# Patient Record
Sex: Female | Born: 1995 | Race: White | Hispanic: No | Marital: Single | Smoking: Never smoker
Health system: Southern US, Community
[De-identification: ages and names within clinical notes are randomized; demographics above are authoritative.]

---

## 2020-11-06 DIAGNOSIS — N3000 Acute cystitis without hematuria: Secondary | ICD-10-CM | POA: Diagnosis not present

## 2021-02-24 ENCOUNTER — Other Ambulatory Visit: Payer: Self-pay

## 2021-02-24 ENCOUNTER — Encounter: Payer: Self-pay | Admitting: Emergency Medicine

## 2021-02-24 DIAGNOSIS — R1032 Left lower quadrant pain: Secondary | ICD-10-CM | POA: Insufficient documentation

## 2021-02-24 DIAGNOSIS — R197 Diarrhea, unspecified: Secondary | ICD-10-CM | POA: Diagnosis not present

## 2021-02-24 DIAGNOSIS — R112 Nausea with vomiting, unspecified: Secondary | ICD-10-CM | POA: Diagnosis not present

## 2021-02-24 LAB — COMPREHENSIVE METABOLIC PANEL
ALT: 19 U/L (ref 0–44)
AST: 22 U/L (ref 15–41)
Albumin: 3.8 g/dL (ref 3.5–5.0)
Alkaline Phosphatase: 55 U/L (ref 38–126)
Anion gap: 8 (ref 5–15)
BUN: 16 mg/dL (ref 6–20)
CO2: 25 mmol/L (ref 22–32)
Calcium: 9.1 mg/dL (ref 8.9–10.3)
Chloride: 107 mmol/L (ref 98–111)
Creatinine, Ser: 0.72 mg/dL (ref 0.44–1.00)
GFR, Estimated: >60 mL/min (ref >60)
Glucose, Bld: 115 mg/dL — ABNORMAL HIGH (ref 70–99)
Potassium: 3.9 mmol/L (ref 3.5–5.1)
Sodium: 140 mmol/L (ref 135–145)
Total Bilirubin: 0.5 mg/dL (ref 0.3–1.2)
Total Protein: 6.7 g/dL (ref 6.5–8.1)

## 2021-02-24 LAB — LIPASE, BLOOD: Lipase: 34 U/L (ref 11–51)

## 2021-02-24 LAB — URINALYSIS, COMPLETE (UACMP) WITH MICROSCOPIC
Bilirubin Urine: NEGATIVE
Glucose, UA: NEGATIVE mg/dL
Ketones, ur: 5 mg/dL — AB
Leukocytes,Ua: NEGATIVE
Nitrite: NEGATIVE
Protein, ur: 30 mg/dL — AB
Specific Gravity, Urine: 1.033 — ABNORMAL HIGH (ref 1.005–1.030)
pH: 5 (ref 5.0–8.0)

## 2021-02-24 LAB — CBC
HCT: 42.6 % (ref 36.0–46.0)
Hemoglobin: 14.3 g/dL (ref 12.0–15.0)
MCH: 30.4 pg (ref 26.0–34.0)
MCHC: 33.6 g/dL (ref 30.0–36.0)
MCV: 90.6 fL (ref 80.0–100.0)
Platelets: 306 10*3/uL (ref 150–400)
RBC: 4.7 MIL/uL (ref 3.87–5.11)
RDW: 12.9 % (ref 11.5–15.5)
WBC: 17 10*3/uL — ABNORMAL HIGH (ref 4.0–10.5)
nRBC: 0 % (ref 0.0–0.2)

## 2021-02-24 LAB — POC URINE PREG, ED: Preg Test, Ur: NEGATIVE

## 2021-02-24 MED ORDER — ONDANSETRON 4 MG PO TBDP
4.0000 mg | ORAL_TABLET | Freq: Once | ORAL | Status: AC | PRN
  Administered 2021-02-24: 4 mg via ORAL
  Filled 2021-02-24: qty 1

## 2021-02-24 NOTE — ED Triage Notes (Signed)
Pt in via POV, reports sudden onset lower abdominal pain with N/V, denies diarrhea.  Vitals WDL, NAD noted at this time.

## 2021-02-25 ENCOUNTER — Emergency Department: Payer: 59

## 2021-02-25 ENCOUNTER — Emergency Department
Admission: EM | Admit: 2021-02-25 | Discharge: 2021-02-25 | Disposition: A | Discharge status: Home / Self Care | Payer: 59 | Attending: Emergency Medicine | Admitting: Emergency Medicine

## 2021-02-25 DIAGNOSIS — R197 Diarrhea, unspecified: Secondary | ICD-10-CM | POA: Diagnosis not present

## 2021-02-25 DIAGNOSIS — R112 Nausea with vomiting, unspecified: Secondary | ICD-10-CM | POA: Diagnosis not present

## 2021-02-25 DIAGNOSIS — R1032 Left lower quadrant pain: Secondary | ICD-10-CM

## 2021-02-25 DIAGNOSIS — R1030 Lower abdominal pain, unspecified: Secondary | ICD-10-CM

## 2021-02-25 MED ORDER — ACETAMINOPHEN 500 MG PO TABS
1000.0000 mg | ORAL_TABLET | Freq: Once | ORAL | Status: AC
  Administered 2021-02-25: 1000 mg via ORAL
  Filled 2021-02-25: qty 2

## 2021-02-25 NOTE — Discharge Instructions (Addendum)
Your workup in the Emergency Department today was reassuring.  We did not find any specific abnormalities.  We recommend you drink plenty of fluids, take your regular medications and/or any new ones prescribed today, and follow up with the doctor(s) listed in these documents as recommended.  Return to the Emergency Department if you develop new or worsening symptoms that concern you.  

## 2021-02-25 NOTE — ED Provider Notes (Signed)
[facility] Emergency Department Provider Note  ____________________________________________   Event Date/Time   First MD Initiated Contact with Patient 02/25/21 0205     (approximate)  I have reviewed the triage vital signs and the nursing notes.   HISTORY  Chief Complaint Abdominal Pain and Emesis    HPI Emily Hart is a 25 y.o. female with no chronic medical issues who presents for evaluation of acute onset pain in the left lower part of her abdomen associate with nausea and vomiting.  The symptoms were severe and nothing in particular made them better or worse, but after she vomited she started to feel better.  She still has a little bit of pain in the left lower part of her abdomen or pelvis but she said that she is actually been having this pain for about a year.  It is generally mild and does not cause her problems and her plan is to get established with an OB/GYN to get an ultrasound because she was told that it may be a cyst, but she has not yet done so.  The pain tonight was like nothing she has felt previously.  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  However it has almost completely resolved now and she feels much better.  She has had known diarrhea.  She denies fever, sore throat, chest pain, shortness of breath.  She is not and has never been sexually active, and she has had no other recent vaginal complaints or concerns except that she has been having irregular menstrual cycles for about a year.     History reviewed. No pertinent past medical history.  There are no problems to display for this patient.   History reviewed. No pertinent surgical history.  Prior to Admission medications   Not on File    Allergies Suprax [cefixime]  No family history on file.  Social History Social History   Tobacco Use   Smoking status: Never   Smokeless tobacco: Never   Vaping Use   Vaping Use: Never used  Substance Use Topics   Alcohol use: Never   Drug use: Never    Review of Systems Constitutional: No fever/chills Eyes: No visual changes. ENT: No sore throat. Cardiovascular: Denies chest pain. Respiratory: Denies shortness of breath. Gastrointestinal: Acute onset left lower quadrant sharp abdominal pain with nausea and vomiting, now improved. Genitourinary: Negative for dysuria.  Irregular menstrual cycles for about a year. Musculoskeletal: Negative for neck pain.  Negative for back pain. Integumentary: Negative for rash. Neurological: Negative for headaches, focal weakness or numbness.   ____________________________________________   PHYSICAL EXAM:  VITAL SIGNS: ED Triage Vitals  Enc Vitals Group     BP 02/24/21 2203 (!) 105/58     Pulse Rate 02/24/21 2203 89     Resp 02/24/21 2203 17     Temp 02/24/21 2203 98.3 F (36.8 C)     Temp Source 02/24/21 2203 Oral     SpO2 02/24/21 2203 99 %     Weight 02/24/21 2204 64 kg (141 lb)     Height 02/24/21 2204 1.6 m (5\' 3" )     Head Circumference --      Peak Flow --      Pain Score 02/24/21 2203 7     Pain Loc --      Pain Edu? --      Excl. in GC? --  Constitutional: Alert and oriented.  Eyes: Conjunctivae are normal.  Head: Atraumatic. Nose: No congestion/rhinnorhea. Mouth/Throat: Patient is wearing a mask. Neck: No stridor.  No meningeal signs.   Cardiovascular: Normal rate, regular rhythm. Good peripheral circulation. Respiratory: Normal respiratory effort.  No retractions. Gastrointestinal: Soft and nondistended.  Mild tenderness to palpation in the left lower quadrant with no rebound no guarding.  No epigastric tenderness, no right upper quadrant tenderness.  Negative Murphy sign. Genitourinary: Deferred due to joint decision-making. Musculoskeletal: No lower extremity tenderness nor edema. No gross deformities of extremities. Neurologic:  Normal speech and language. No  gross focal neurologic deficits are appreciated.  Skin:  Skin is warm, dry and intact. Psychiatric: Mood and affect are normal. Speech and behavior are normal.  ____________________________________________   LABS (all labs ordered are listed, but only abnormal results are displayed)  Labs Reviewed  COMPREHENSIVE METABOLIC PANEL - Abnormal; Notable for the following components:      Result Value   Glucose, Bld 115 (*)    All other components within normal limits  CBC - Abnormal; Notable for the following components:   WBC 17.0 (*)    All other components within normal limits  URINALYSIS, COMPLETE (UACMP) WITH MICROSCOPIC - Abnormal; Notable for the following components:   Color, Urine YELLOW (*)    APPearance HAZY (*)    Specific Gravity, Urine 1.033 (*)    Hgb urine dipstick SMALL (*)    Ketones, ur 5 (*)    Protein, ur 30 (*)    Bacteria, UA RARE (*)    All other components within normal limits  LIPASE, BLOOD  POC URINE PREG, ED   ____________________________________________   RADIOLOGY I, Loleta Roseory Shae Augello, personally viewed and evaluated these images (plain radiographs) as part of my medical decision making, as well as reviewing the written report by the radiologist.  ED MD interpretation: Normal ultrasound with no evidence of mass, bleeding, nor torsion  Official radiology report(s): US PELVIS (TRANSABDOMINAL ONLY)  Result Date: 02/25/2021 CLINICAL DATA:  Acute onset left lower quadrant pain with nausea and vomiting. EXAM: TRANSABDOMINAL ULTRASOUND OF PELVIS DOPPLER ULTRASOUND OF OVARIES TECHNIQUE: Transabdominal ultrasound examination of the pelvis was performed including evaluation of the uterus, ovaries, adnexal regions, and pelvic cul-de-sac. Color and duplex Doppler ultrasound was utilized to evaluate blood flow to the ovaries. COMPARISON:  None. FINDINGS: Uterus Measurements: 6.8 x 2.7 x 3.5 cm = volume: 34 mL. No fibroids or other mass visualized. Endometrium Thickness: 6  mm.  No focal abnormality visualized. Right ovary Measurements: 2.7 x 1.7 x 2.0 cm = volume: 5 mL. Normal appearance/no adnexal mass. Left ovary Measurements: 2.8 x 1.6 x 2.3 cm = volume: 5 mL. Normal appearance/no adnexal mass. Pulsed Doppler evaluation demonstrates normal low-resistance arterial and venous waveforms in both ovaries. Other: None. IMPRESSION: Unremarkable exam. No findings to explain the patient's history of left lower quadrant pain. Electronically Signed   By: Kennith CenterEric  Mansell M.D.   On: 02/25/2021 05:14   US PELVIC DOPPLER (TORSION R/O OR MASS ARTERIAL FLOW)  Result Date: 02/25/2021 CLINICAL DATA:  Acute onset left lower quadrant pain with nausea and vomiting. EXAM: TRANSABDOMINAL ULTRASOUND OF PELVIS DOPPLER ULTRASOUND OF OVARIES TECHNIQUE: Transabdominal ultrasound examination of the pelvis was performed including evaluation of the uterus, ovaries, adnexal regions, and pelvic cul-de-sac. Color and duplex Doppler ultrasound was utilized to evaluate blood flow to the ovaries. COMPARISON:  None. FINDINGS: Uterus Measurements: 6.8 x 2.7 x 3.5 cm = volume: 34 mL. No fibroids or other  mass visualized. Endometrium Thickness: 6 mm.  No focal abnormality visualized. Right ovary Measurements: 2.7 x 1.7 x 2.0 cm = volume: 5 mL. Normal appearance/no adnexal mass. Left ovary Measurements: 2.8 x 1.6 x 2.3 cm = volume: 5 mL. Normal appearance/no adnexal mass. Pulsed Doppler evaluation demonstrates normal low-resistance arterial and venous waveforms in both ovaries. Other: None. IMPRESSION: Unremarkable exam. No findings to explain the patient's history of left lower quadrant pain. Electronically Signed   By: Kennith Center M.D.   On: 02/25/2021 05:14    ____________________________________________   PROCEDURES   Procedure(s) performed (including Critical Care):  Procedures   ____________________________________________   INITIAL IMPRESSION / MDM / ASSESSMENT AND PLAN / ED COURSE  As part of my  medical decision making, I reviewed the following data within the electronic MEDICAL RECORD NUMBER Nursing notes reviewed and incorporated, Labs reviewed , Old chart reviewed, and Notes from prior ED visits   Differential diagnosis includes, but is not limited to , ovarian torsion, ovarian cyst, endometriosis, foodborne illness or GI virus, diverticulitis, epiploic appendagitis.  Patient feels much better but still has some tenderness to palpation.  She has been having nonspecific symptoms for about a year which suggest to me that it is not a persistent cyst but it is possible she could have a mass on an ovary that has become large enough that she had an episode tonight and intermittent and/or partial torsion.  Given the acuity of the symptoms and the history, we discussed it and believe it is reasonable to proceed with a pelvic ultrasound including Dopplers.  The patient has not had sexual intercourse and has no other complaints.  She and I both agree that there is no indication for pelvic exam at this time.  She is comfortable with the plan for the transvaginal pelvic ultrasound but if he becomes uncomfortable she will talk with the sonographer and switch to transabdominal only.  Urinalysis is generally reassuring, there is rare bacteria and 6-10 white blood cells but she is not having dysuria.  I will send a urine culture but not treat empirically.  She has a leukocytosis of 17 but this is likely due to the acute episode including vomiting rather than being indicative of chronic infection.  Her CMP is normal Her urine pregnancy test is negative.     Clinical Course as of 02/25/21 0607  Thu Feb 25, 2021  0604 Patient's work-up has been reassuring.  Her ultrasound did not show any acute abnormality including no ovarian cysts or evidence of torsion.  She says she has felt fine since that initial episode that brought her here.  She has been ambulating around the ED, going to the bathroom several times, and  is in no distress with no persistent acute pain or nausea/vomiting.  She feels comfortable with the plan for discharge and I encouraged outpatient follow-up.  I gave my usual and customary return precautions. [CF]    Clinical Course User Index [CF] Loleta Rose, MD     ____________________________________________  FINAL CLINICAL IMPRESSION(S) / ED DIAGNOSES  Final diagnoses:  Lower abdominal pain  Non-intractable vomiting with nausea, unspecified vomiting type     MEDICATIONS GIVEN DURING THIS VISIT:  Medications  ondansetron (ZOFRAN-ODT) disintegrating tablet 4 mg (4 mg Oral Given 02/24/21 2214)  acetaminophen (TYLENOL) tablet 1,000 mg (1,000 mg Oral Given 02/25/21 0238)     ED Discharge Orders     None        Note:  This document was prepared using  [employer]agon [employer] and may include unintentional dictation errors.   Loleta Rose, MD 02/25/21 [phone]

## 2021-02-25 NOTE — ED Notes (Signed)
ED Provider at bedside. 

## 2021-02-25 NOTE — ED Notes (Signed)
Pt. To U/S 

## 2021-02-26 DIAGNOSIS — R102 Pelvic and perineal pain: Secondary | ICD-10-CM | POA: Diagnosis not present

## 2021-02-26 DIAGNOSIS — R1031 Right lower quadrant pain: Secondary | ICD-10-CM | POA: Diagnosis not present

## 2021-02-26 DIAGNOSIS — N921 Excessive and frequent menstruation with irregular cycle: Secondary | ICD-10-CM | POA: Diagnosis not present

## 2021-05-19 ENCOUNTER — Other Ambulatory Visit: Payer: Self-pay

## 2021-05-19 ENCOUNTER — Ambulatory Visit
Admission: RE | Admit: 2021-05-19 | Discharge: 2021-05-19 | Disposition: A | Discharge status: Home / Self Care | Payer: 59 | Source: Ambulatory Visit | Attending: Internal Medicine | Admitting: Internal Medicine

## 2021-05-19 VITALS — BP 132/78 | HR 128 | Temp 101.0°F | Resp 18

## 2021-05-19 DIAGNOSIS — J09X2 Influenza due to identified novel influenza A virus with other respiratory manifestations: Secondary | ICD-10-CM

## 2021-05-19 MED ORDER — BENZONATATE 200 MG PO CAPS: 200.0000 mg | ORAL_CAPSULE | Freq: Three times a day (TID) | ORAL | 0 refills | Status: DC | PRN

## 2021-05-19 MED ORDER — OSELTAMIVIR PHOSPHATE 75 MG PO CAPS: 75.0000 mg | ORAL_CAPSULE | Freq: Two times a day (BID) | ORAL | 0 refills | Status: AC

## 2021-05-19 NOTE — Discharge Instructions (Signed)
You may return back to being around the public if no fever for 24 hours

## 2021-05-19 NOTE — ED Triage Notes (Signed)
Pt here with flu-like sx since yesterday. Fever starting today.

## 2021-05-25 DIAGNOSIS — Z01419 Encounter for gynecological examination (general) (routine) without abnormal findings: Secondary | ICD-10-CM | POA: Diagnosis not present

## 2021-05-25 DIAGNOSIS — Z113 Encounter for screening for infections with a predominantly sexual mode of transmission: Secondary | ICD-10-CM | POA: Diagnosis not present

## 2021-05-29 ENCOUNTER — Ambulatory Visit (INDEPENDENT_AMBULATORY_CARE_PROVIDER_SITE_OTHER): Payer: 59

## 2021-05-29 ENCOUNTER — Encounter: Payer: Self-pay | Admitting: Emergency Medicine

## 2021-05-29 ENCOUNTER — Other Ambulatory Visit: Payer: Self-pay

## 2021-05-29 ENCOUNTER — Ambulatory Visit: Payer: 59

## 2021-05-29 ENCOUNTER — Ambulatory Visit
Admission: EM | Admit: 2021-05-29 | Discharge: 2021-05-29 | Disposition: A | Discharge status: Home / Self Care | Payer: 59 | Attending: Emergency Medicine | Admitting: Emergency Medicine

## 2021-05-29 DIAGNOSIS — R0782 Intercostal pain: Secondary | ICD-10-CM | POA: Diagnosis not present

## 2021-05-29 DIAGNOSIS — J40 Bronchitis, not specified as acute or chronic: Secondary | ICD-10-CM | POA: Diagnosis not present

## 2021-05-29 DIAGNOSIS — R059 Cough, unspecified: Secondary | ICD-10-CM

## 2021-05-29 DIAGNOSIS — R0781 Pleurodynia: Secondary | ICD-10-CM

## 2021-05-29 DIAGNOSIS — J329 Chronic sinusitis, unspecified: Secondary | ICD-10-CM | POA: Diagnosis not present

## 2021-05-29 DIAGNOSIS — R053 Chronic cough: Secondary | ICD-10-CM | POA: Diagnosis not present

## 2021-05-29 MED ORDER — BENZONATATE 100 MG PO CAPS: 100.0000 mg | ORAL_CAPSULE | Freq: Three times a day (TID) | ORAL | 0 refills | Status: AC | PRN

## 2021-05-29 NOTE — ED Provider Notes (Signed)
Emily Hart    CSN: [id number] Arrival date & time: 05/29/21  0815      History   Chief Complaint Chief Complaint  Patient presents with   Cough   Chest Pain    Rib cage    HPI Emily Hart is a 25 y.o. female.  Patient presents with 2 to 3-week of persistent cough.  She has sharp pain in her right rib cage due to cough and requests rib xray.  She denies chest pain, shortness of breath, fever, chills, ear pain, sore, or other symptoms.  She had a telemedicine visit on 05/11/2021; diagnosed with acute sinusitis; treated with Augmentin.  She was diagnosed with influenza A on 05/19/2021 and treated with Tamiflu.  LMP: last week.    The history is provided by the patient and medical records.   No past medical history on file.  There are no problems to display for this patient.   No past surgical history on file.  OB History   No obstetric history on file.      Home Medications    Prior to Admission medications   Medication Sig Start Date End Date Taking? Authorizing Provider  benzonatate (TESSALON) 100 MG capsule Take 1 capsule (100 mg total) by mouth 3 (three) times daily as needed for cough. 05/29/21  Yes Mickie Bail, NP  oseltamivir (TAMIFLU) 75 MG capsule Take 1 capsule (75 mg total) by mouth every 12 (twelve) hours. 05/19/21   Rodriguez-Southworth, Nettie Elm, PA-C    Family History Family History  Family history unknown: Yes    Social History Social History   Tobacco Use   Smoking status: Never   Smokeless tobacco: Never  Vaping Use   Vaping Use: Never used  Substance Use Topics   Alcohol use: Never   Drug use: Never     Allergies   Suprax [cefixime]   Review of Systems Review of Systems  Constitutional:  Negative for chills and fever.  HENT:  Negative for ear pain and sore throat.   Respiratory:  Positive for cough. Negative for shortness of breath.        Right ribcage pain  Cardiovascular:  Negative for chest pain and  palpitations.  Gastrointestinal:  Negative for diarrhea and vomiting.  Skin:  Negative for color change and rash.  All other systems reviewed and are negative.   Physical Exam Triage Vital Signs ED Triage Vitals [05/29/21 0829]  Enc Vitals Group     BP 114/72     Pulse Rate 88     Resp 16     Temp 98.4 F (36.9 C)     Temp Source Oral     SpO2 96 %     Weight      Height      Head Circumference      Peak Flow      Pain Score      Pain Loc      Pain Edu?      Excl. in GC?    No data found.  Updated Vital Signs BP 114/72 (BP Location: Left Arm)   Pulse 88   Temp 98.4 F (36.9 C) (Oral)   Resp 16   SpO2 96%   Visual Acuity Right Eye Distance:   Left Eye Distance:   Bilateral Distance:    Right Eye Near:   Left Eye Near:    Bilateral Near:     Physical Exam Vitals and nursing note reviewed.  Constitutional:  General: She is not in acute distress.    Appearance: She is well-developed.  HENT:     Right Ear: Tympanic membrane normal.     Left Ear: Tympanic membrane normal.     Nose: Nose normal.     Mouth/Throat:     Mouth: Mucous membranes are moist.     Pharynx: Oropharynx is clear.  Cardiovascular:     Rate and Rhythm: Normal rate and regular rhythm.     Heart sounds: Normal heart sounds.  Pulmonary:     Effort: Pulmonary effort is normal. No respiratory distress.     Breath sounds: Normal breath sounds. No wheezing, rhonchi or rales.  Chest:    Abdominal:     Palpations: Abdomen is soft.     Tenderness: There is no abdominal tenderness.  Musculoskeletal:     Cervical back: Neck supple.  Skin:    General: Skin is warm and dry.  Neurological:     Mental Status: She is alert.  Psychiatric:        Mood and Affect: Mood normal.        Behavior: Behavior normal.     UC Treatments / Results  Labs (all labs ordered are listed, but only abnormal results are displayed) Labs Reviewed - No data to display  EKG   Radiology DG Ribs  Unilateral W/Chest Right  Result Date: 05/29/2021 CLINICAL DATA:  Rib pain.  Cough for 2 weeks. EXAM: RIGHT RIBS AND CHEST - 3+ VIEW COMPARISON:  None. FINDINGS: Both lungs are clear. Negative for a pneumothorax. Heart and mediastinum are within normal limits. No evidence for a displaced right rib fracture. IMPRESSION: Negative. Electronically Signed   By: Richarda Overlie M.D.   On: 05/29/2021 09:12    Procedures Procedures (including critical care time)  Medications Ordered in UC Medications - No data to display  Initial Impression / Assessment and Plan / UC Course  I have reviewed the triage vital signs and the nursing notes.  Pertinent labs & imaging results that were available during my care of the patient were reviewed by me and considered in my medical decision making (see chart for details).  Right rib pain, persistent cough.  Chest and rib x-ray negative.  No respiratory distress, O2 sat 96% on room air.  Treating with Tessalon Perles.  Instructed patient to follow-up with her PCP if her symptoms are not improving.  She agrees to plan of care.   Final Clinical Impressions(s) / UC Diagnoses   Final diagnoses:  Rib pain on right side  Persistent cough     Discharge Instructions      Your chest and rib x-ray is normal.  Follow up with your primary care provider if your symptoms are not improving.         ED Prescriptions     Medication Sig Dispense Auth. Provider   benzonatate (TESSALON) 100 MG capsule Take 1 capsule (100 mg total) by mouth 3 (three) times daily as needed for cough. 21 capsule Mickie Bail, NP      PDMP not reviewed this encounter.   Mickie Bail, NP 05/29/21 [phone]

## 2021-05-29 NOTE — ED Triage Notes (Addendum)
Pt here with cough after flu A that is persistent and getting worse over the last 2 weeks. Pt reports coughing so much that she now has a sharp pain on right side of rib cage.

## 2021-05-29 NOTE — Discharge Instructions (Addendum)
Your chest and rib x-ray is normal.  Follow up with your primary care provider if your symptoms are not improving.

## 2021-08-09 DIAGNOSIS — R5383 Other fatigue: Secondary | ICD-10-CM | POA: Diagnosis not present

## 2021-08-09 DIAGNOSIS — J0100 Acute maxillary sinusitis, unspecified: Secondary | ICD-10-CM | POA: Diagnosis not present

## 2021-08-09 DIAGNOSIS — T3695XA Adverse effect of unspecified systemic antibiotic, initial encounter: Secondary | ICD-10-CM | POA: Diagnosis not present

## 2021-08-09 DIAGNOSIS — B379 Candidiasis, unspecified: Secondary | ICD-10-CM | POA: Diagnosis not present

## 2021-10-25 ENCOUNTER — Ambulatory Visit
Admission: EM | Admit: 2021-10-25 | Discharge: 2021-10-25 | Disposition: A | Discharge status: Home / Self Care | Payer: 59

## 2021-10-25 ENCOUNTER — Encounter: Payer: Self-pay | Admitting: Emergency Medicine

## 2021-10-25 DIAGNOSIS — R21 Rash and other nonspecific skin eruption: Secondary | ICD-10-CM

## 2021-10-25 NOTE — ED Provider Notes (Signed)
?[facility] ? ? ? ?CSN: [id number] ?Arrival date & time: 10/25/21  1550 ? ? ?  ? ?History   ?Chief Complaint ?Chief Complaint  ?Patient presents with  ? Insect Bite  ? ? ?HPI ?Emily Hart is a 26 y.o. female.  Patient presents with a papule on her left wrist today.  She went to her bathroom 1 hour ago and noticed numerous small bugs in the bathroom.  She had some of the bugs on her legs and in her hair because she had sat on the toilet.  She has no other lesions or insect bites on the rest of her body; only the one lesion on her wrist.  Since seeing the bugs, she has been itching all over.  She sprayed the bugs in the bathroom with bug spray and then came directly here to be checked.  No other symptoms.  No pertinent medical history.  Patient has several of the bugs in a plastic bag. ? ? ?The history is provided by the patient.  ? ?History reviewed. No pertinent past medical history. ? ?There are no problems to display for this patient. ? ? ?History reviewed. No pertinent surgical history. ? ?OB History   ?No obstetric history on file. ?  ? ? ? ?Home Medications   ? ?Prior to Admission medications   ?Medication Sig Start Date End Date Taking? Authorizing Provider  ?benzonatate (TESSALON) 100 MG capsule Take 1 capsule (100 mg total) by mouth 3 (three) times daily as needed for cough. 05/29/21   Mickie Bail, NP  ?Garnette Scheuermann 0.15-0.02/0.01 MG (21/5) tablet Take 1 tablet by mouth daily. 10/21/21   [provider]  ?oseltamivir (TAMIFLU) 75 MG capsule Take 1 capsule (75 mg total) by mouth every 12 (twelve) hours. 05/19/21   Rodriguez-Southworth, Nettie Elm, PA-C  ? ? ?Family History ?Family History  ?Family history unknown: Yes  ? ? ?Social History ?Social History  ? ?Tobacco Use  ? Smoking status: Never  ? Smokeless tobacco: Never  ?Vaping Use  ? Vaping Use: Never used  ?Substance Use Topics  ? Alcohol use: Never  ? Drug use: Never  ? ? ? ?Allergies   ?Nortriptyline and Suprax [cefixime] ? ? ?Review of  Systems ?Review of Systems  ?Constitutional:  Negative for chills and fever.  ?Skin:  Positive for rash. Negative for color change.  ?     Papule on left wrist.  ?All other systems reviewed and are negative. ? ? ?Physical Exam ?Triage Vital Signs ?ED Triage Vitals  ?Enc Vitals Group  ?   BP   ?   Pulse   ?   Resp   ?   Temp   ?   Temp src   ?   SpO2   ?   Weight   ?   Height   ?   Head Circumference   ?   Peak Flow   ?   Pain Score   ?   Pain Loc   ?   Pain Edu?   ?   Excl. in GC?   ? ?No data found. ? ?Updated Vital Signs ?BP (!) 136/93   Pulse (!) 115   Temp 98.2 ?F (36.8 ?C)   Resp 18   LMP 10/24/2021   SpO2 97%  ? ?Visual Acuity ?Right Eye Distance:   ?Left Eye Distance:   ?Bilateral Distance:   ? ?Right Eye Near:   ?Left Eye Near:    ?Bilateral Near:    ? ?  Physical Exam ?Vitals and nursing note reviewed.  ?Constitutional:   ?   General: She is not in acute distress. ?   Appearance: Normal appearance. She is well-developed. She is not ill-appearing.  ?HENT:  ?   Mouth/Throat:  ?   Mouth: Mucous membranes are moist.  ?Cardiovascular:  ?   Rate and Rhythm: Normal rate and regular rhythm.  ?Pulmonary:  ?   Effort: Pulmonary effort is normal. No respiratory distress.  ?Musculoskeletal:  ?   Cervical back: Neck supple.  ?Skin: ?   General: Skin is warm and dry.  ?   Findings: Lesion present.  ?   Comments: 1 small papule on left wrist.  No other rash.  No nits or bugs in hair noted.  ?Neurological:  ?   Mental Status: She is alert and oriented to person, place, and time.  ?   Gait: Gait normal.  ?Psychiatric:     ?   Mood and Affect: Mood normal.     ?   Behavior: Behavior normal.  ? ? ? ?UC Treatments / Results  ?Labs ?(all labs ordered are listed, but only abnormal results are displayed) ?Labs Reviewed - No data to display ? ?EKG ? ? ?Radiology ?No results found. ? ?Procedures ?Procedures (including critical care time) ? ?Medications Ordered in UC ?Medications - No data to display ? ?Initial Impression /  Assessment and Plan / UC Course  ?I have reviewed the triage vital signs and the nursing notes. ? ?Pertinent labs & imaging results that were available during my care of the patient were reviewed by me and considered in my medical decision making (see chart for details). ? ? Rash.  Patient brought several of the bugs with her in a plastic bag.  They are pinpoint size and crawling around in the bag.  Patient does not appear to have any bug bites on her body; no nits or bugs noted in her hair.  No rash in the webs of her fingers or other skin folds.  Discussed with patient that she does not appear to have body or head lice.  Discussed home cleaning including linens as a precaution.  Discussed Benadryl or Zyrtec for itching as needed.  Instructed patient to follow up with her PCP if her symptoms are not improving.  She agrees to plan of care.  ? ? ? ?Final Clinical Impressions(s) / UC Diagnoses  ? ?Final diagnoses:  ?Rash  ? ? ? ?Discharge Instructions   ? ?  ?Take Zyrtec as needed for itching.  Follow up with your primary care provider if your symptoms are not improving.   ? ? ? ? ? ?ED Prescriptions   ?None ?  ? ?PDMP not reviewed this encounter. ?  ?Mickie Bail, NP ?10/25/21 1625 ? ?

## 2021-10-25 NOTE — Discharge Instructions (Addendum)
Take Zyrtec as needed for itching.  Follow up with your primary care provider if your symptoms are not improving.   ? ?

## 2021-10-25 NOTE — ED Triage Notes (Signed)
Pt presents with bugs on her legs and hair. She noticed a a lot of bugs on her toilet seat today.   ?

## 2022-01-21 DIAGNOSIS — Z6824 Body mass index (BMI) 24.0-24.9, adult: Secondary | ICD-10-CM | POA: Diagnosis not present

## 2022-01-21 DIAGNOSIS — R300 Dysuria: Secondary | ICD-10-CM | POA: Diagnosis not present

## 2022-01-28 DIAGNOSIS — R399 Unspecified symptoms and signs involving the genitourinary system: Secondary | ICD-10-CM | POA: Diagnosis not present

## 2022-02-03 DIAGNOSIS — R300 Dysuria: Secondary | ICD-10-CM | POA: Diagnosis not present

## 2022-02-03 DIAGNOSIS — R350 Frequency of micturition: Secondary | ICD-10-CM | POA: Diagnosis not present

## 2022-02-03 DIAGNOSIS — R319 Hematuria, unspecified: Secondary | ICD-10-CM | POA: Diagnosis not present

## 2022-02-04 DIAGNOSIS — N3001 Acute cystitis with hematuria: Secondary | ICD-10-CM | POA: Diagnosis not present

## 2022-02-04 DIAGNOSIS — R300 Dysuria: Secondary | ICD-10-CM | POA: Diagnosis not present

## 2022-02-04 DIAGNOSIS — R21 Rash and other nonspecific skin eruption: Secondary | ICD-10-CM | POA: Diagnosis not present

## 2022-02-04 DIAGNOSIS — L290 Pruritus ani: Secondary | ICD-10-CM | POA: Diagnosis not present

## 2022-02-18 DIAGNOSIS — N390 Urinary tract infection, site not specified: Secondary | ICD-10-CM | POA: Diagnosis not present

## 2022-02-18 DIAGNOSIS — R300 Dysuria: Secondary | ICD-10-CM | POA: Diagnosis not present

## 2022-02-24 DIAGNOSIS — Z87440 Personal history of urinary (tract) infections: Secondary | ICD-10-CM | POA: Diagnosis not present

## 2022-02-24 DIAGNOSIS — N3001 Acute cystitis with hematuria: Secondary | ICD-10-CM | POA: Diagnosis not present

## 2022-02-24 DIAGNOSIS — R300 Dysuria: Secondary | ICD-10-CM | POA: Diagnosis not present

## 2022-03-10 DIAGNOSIS — R300 Dysuria: Secondary | ICD-10-CM | POA: Diagnosis not present

## 2022-03-10 DIAGNOSIS — Z87440 Personal history of urinary (tract) infections: Secondary | ICD-10-CM | POA: Diagnosis not present

## 2022-03-10 DIAGNOSIS — R350 Frequency of micturition: Secondary | ICD-10-CM | POA: Diagnosis not present

## 2022-05-27 DIAGNOSIS — Z01419 Encounter for gynecological examination (general) (routine) without abnormal findings: Secondary | ICD-10-CM | POA: Diagnosis not present

## 2022-05-27 DIAGNOSIS — N390 Urinary tract infection, site not specified: Secondary | ICD-10-CM | POA: Diagnosis not present

## 2022-05-27 DIAGNOSIS — Z136 Encounter for screening for cardiovascular disorders: Secondary | ICD-10-CM | POA: Diagnosis not present

## 2023-11-12 IMAGING — DX DG RIBS W/ CHEST 3+V*R*
3 series · 3 of 3 positions shown · non-contrast
Comparison: None.

CLINICAL DATA: Rib pain.  Cough for 2 weeks.

EXAM:
RIGHT RIBS AND CHEST - 3+ VIEW

[chest pa]
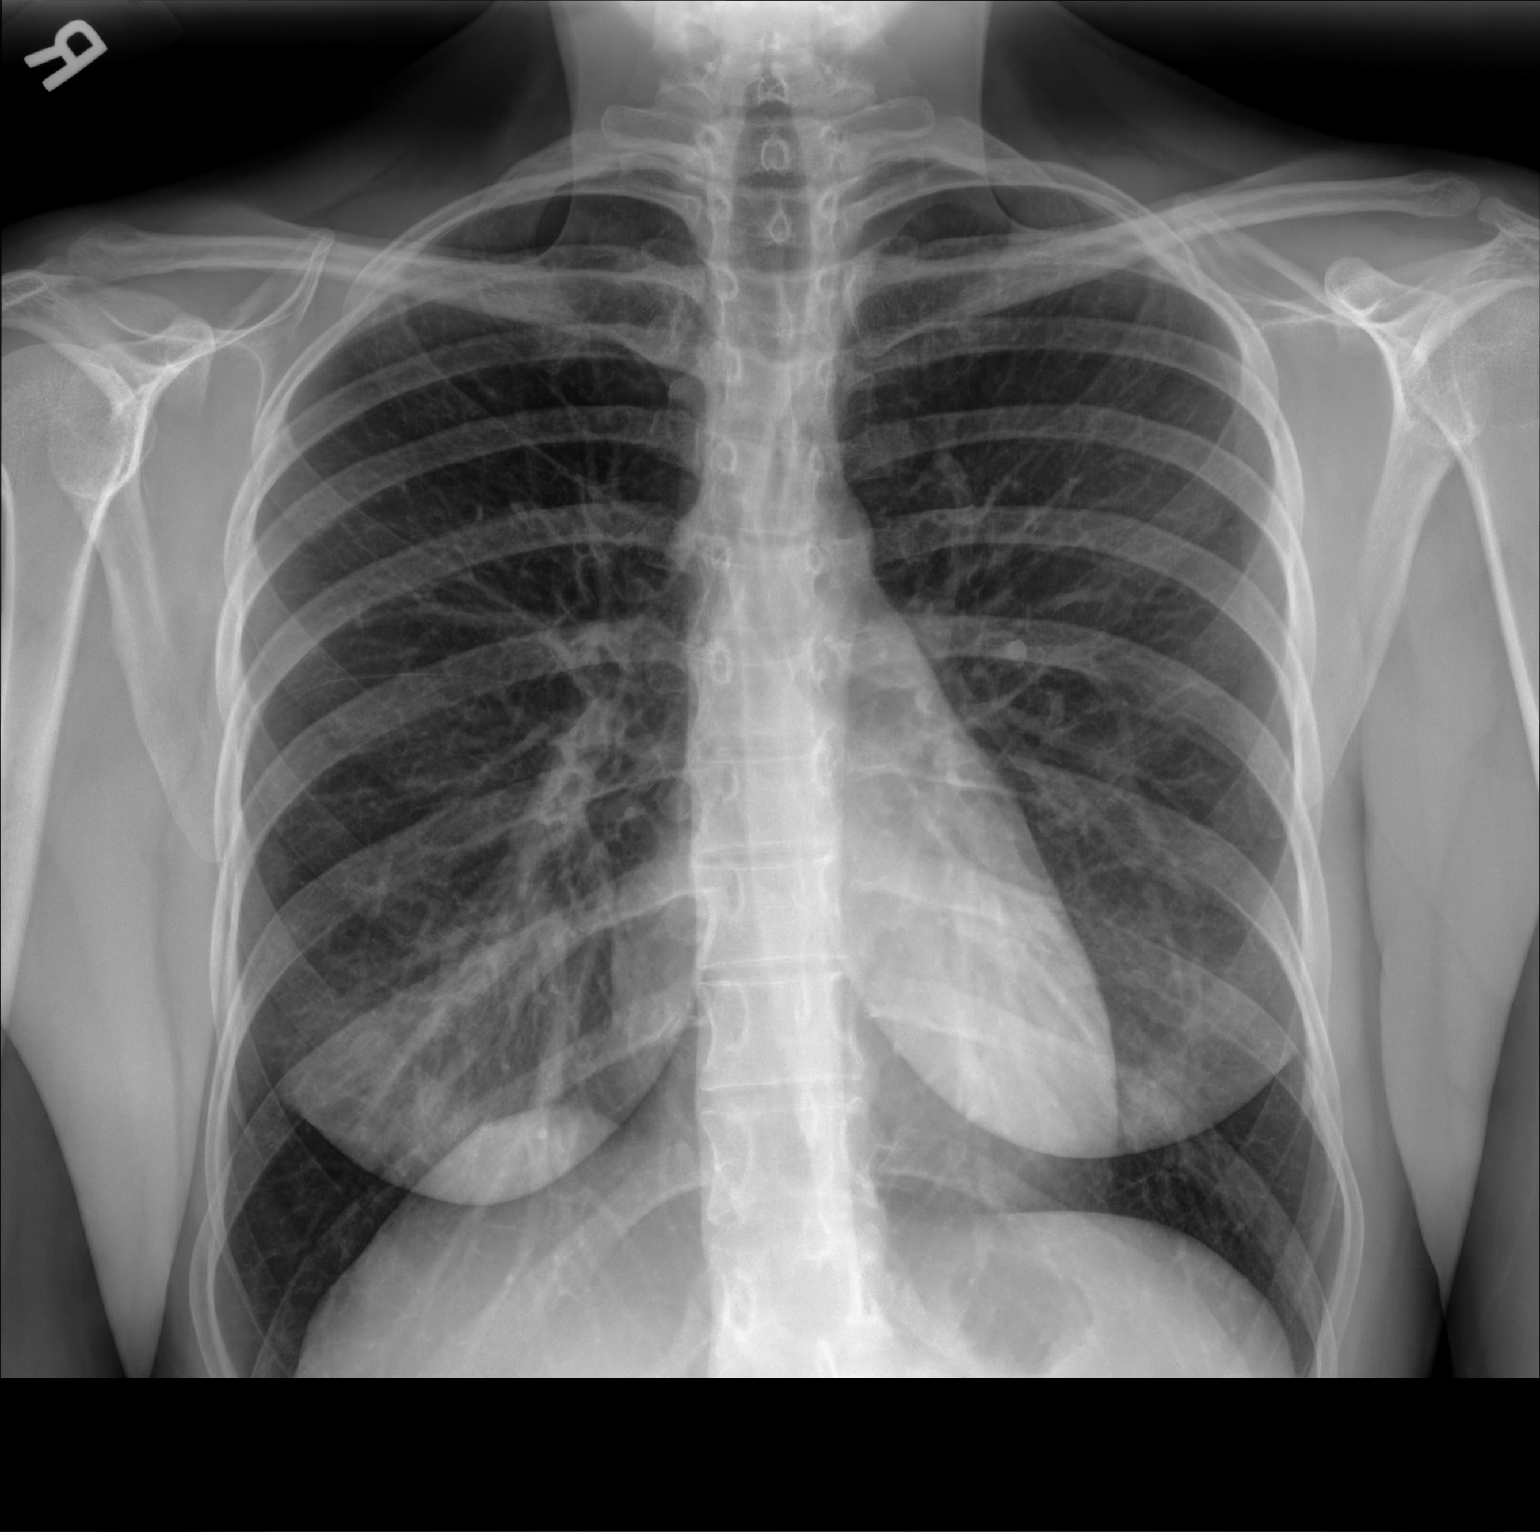

[hemithorax (ribs) ap]
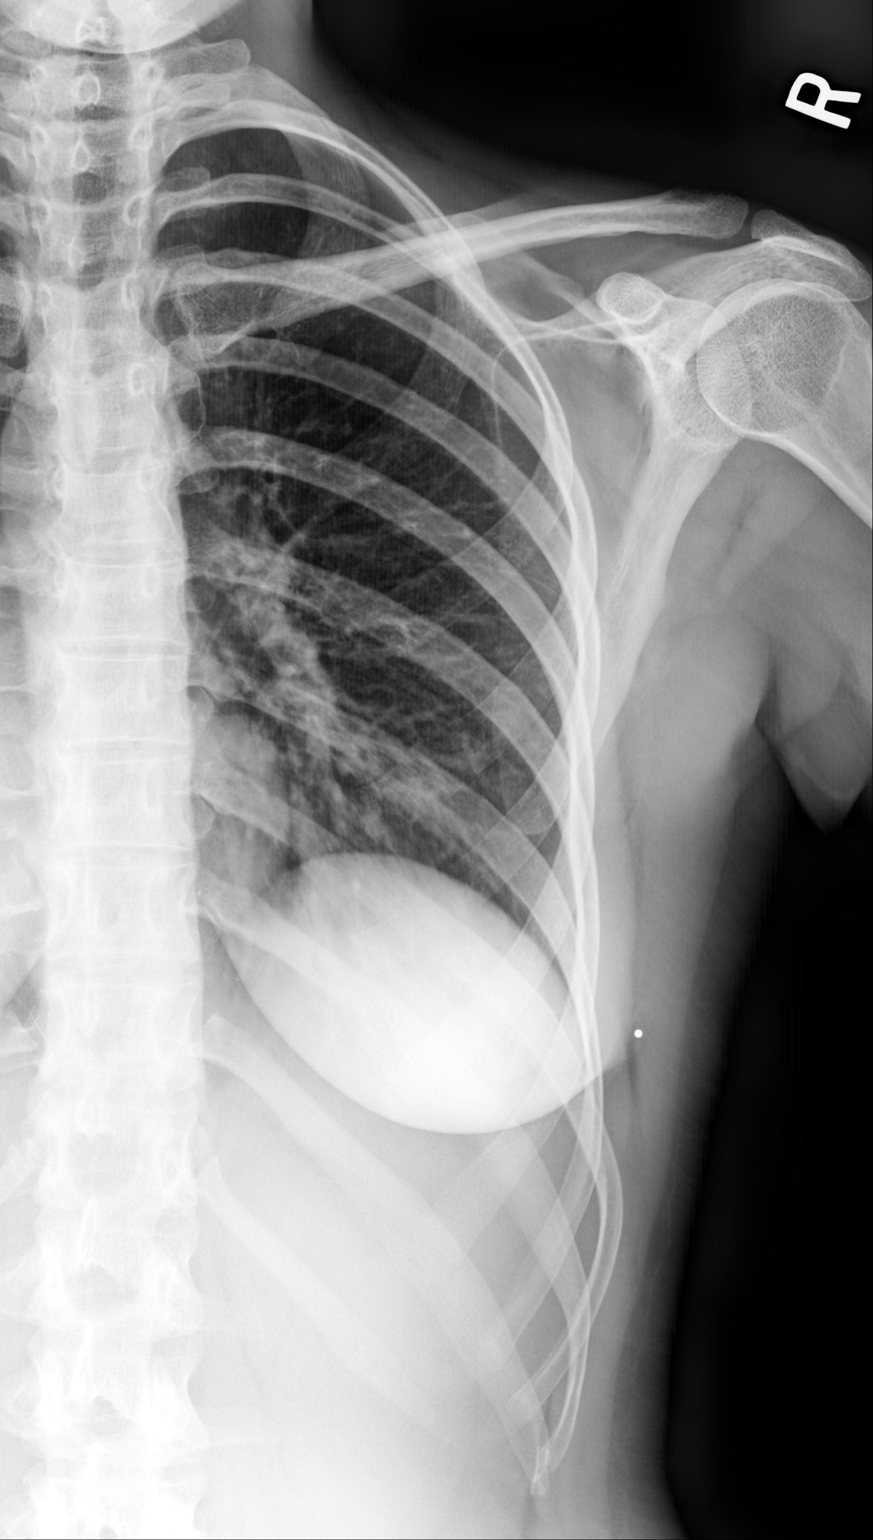

[hemithorax (ribs) mlo]
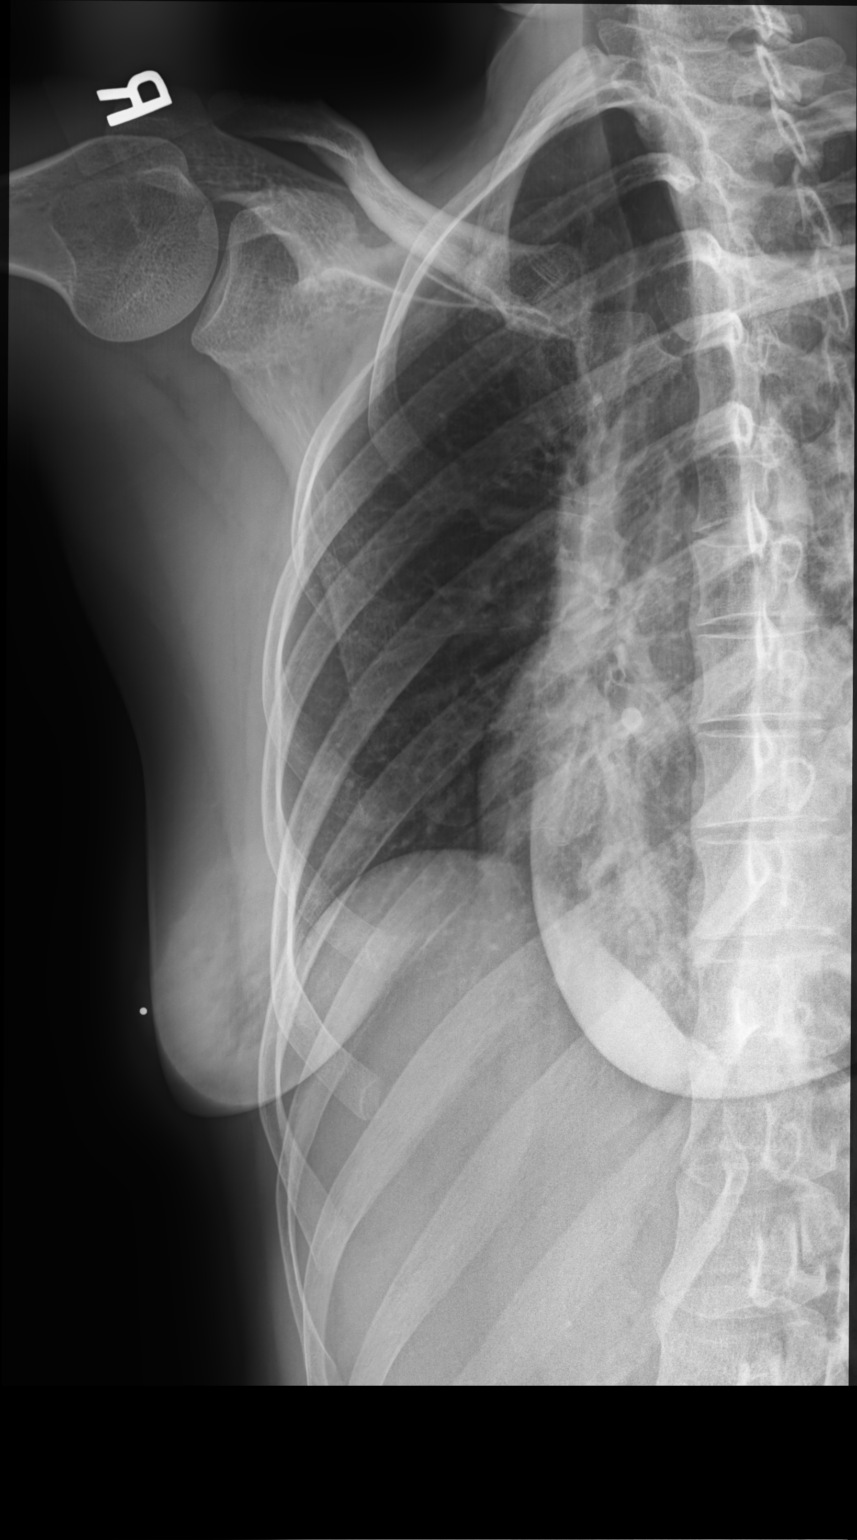

[3 of 3 positions shown; findings below may reference images not displayed]

FINDINGS: Both lungs are clear. Negative for a pneumothorax. Heart and
mediastinum are within normal limits. No evidence for a displaced
right rib fracture.
IMPRESSION: Negative.
